# Patient Record
Sex: Female | Born: 1960 | Race: White | Hispanic: No | Marital: Married | Smoking: Never smoker
Health system: Southern US, Community
[De-identification: ages and names within clinical notes are randomized; demographics above are authoritative.]

## PROBLEM LIST (undated history)

## (undated) DIAGNOSIS — E119 Type 2 diabetes mellitus without complications: Secondary | ICD-10-CM

## (undated) DIAGNOSIS — N200 Calculus of kidney: Secondary | ICD-10-CM

## (undated) DIAGNOSIS — I10 Essential (primary) hypertension: Secondary | ICD-10-CM

## (undated) DIAGNOSIS — G4730 Sleep apnea, unspecified: Secondary | ICD-10-CM

## (undated) HISTORY — PX: LITHOTRIPSY: SUR834

## (undated) HISTORY — PX: KIDNEY STONE SURGERY: SHX686

## (undated) HISTORY — PX: BACK SURGERY: SHX140

## (undated) HISTORY — PX: TUBAL LIGATION: SHX77

## (undated) HISTORY — PX: TONSILLECTOMY: SUR1361

---

## 2001-09-09 ENCOUNTER — Encounter: Admission: RE | Admit: 2001-09-09 | Discharge: 2001-09-09 | Payer: Self-pay | Admitting: Unknown Physician Specialty

## 2001-09-09 ENCOUNTER — Encounter: Payer: Self-pay | Admitting: Unknown Physician Specialty

## 2001-10-09 ENCOUNTER — Encounter: Payer: Self-pay | Admitting: Neurosurgery

## 2001-10-11 ENCOUNTER — Inpatient Hospital Stay (HOSPITAL_COMMUNITY): Admission: RE | Admit: 2001-10-11 | Discharge: 2001-10-12 | Payer: Self-pay | Admitting: Neurosurgery

## 2001-10-11 ENCOUNTER — Encounter: Payer: Self-pay | Admitting: Neurosurgery

## 2006-10-14 ENCOUNTER — Emergency Department (HOSPITAL_COMMUNITY): Admission: EM | Admit: 2006-10-14 | Discharge: 2006-10-14 | Payer: Self-pay | Admitting: Emergency Medicine

## 2007-05-09 ENCOUNTER — Encounter (HOSPITAL_COMMUNITY): Admission: RE | Admit: 2007-05-09 | Discharge: 2007-06-08 | Payer: Self-pay | Admitting: Neurology

## 2007-05-28 ENCOUNTER — Emergency Department (HOSPITAL_COMMUNITY): Admission: EM | Admit: 2007-05-28 | Discharge: 2007-05-28 | Payer: Self-pay | Admitting: *Deleted

## 2007-06-12 ENCOUNTER — Encounter (HOSPITAL_COMMUNITY): Admission: RE | Admit: 2007-06-12 | Discharge: 2007-07-12 | Payer: Self-pay | Admitting: Neurology

## 2008-05-31 ENCOUNTER — Emergency Department (HOSPITAL_COMMUNITY): Admission: EM | Admit: 2008-05-31 | Discharge: 2008-06-01 | Payer: Self-pay | Admitting: Emergency Medicine

## 2008-06-01 ENCOUNTER — Encounter: Payer: Self-pay | Admitting: Internal Medicine

## 2008-06-14 ENCOUNTER — Emergency Department (HOSPITAL_COMMUNITY): Admission: EM | Admit: 2008-06-14 | Discharge: 2008-06-14 | Payer: Self-pay | Admitting: Emergency Medicine

## 2008-06-14 ENCOUNTER — Encounter: Payer: Self-pay | Admitting: Internal Medicine

## 2008-06-17 ENCOUNTER — Ambulatory Visit: Payer: Self-pay | Admitting: Internal Medicine

## 2008-06-17 DIAGNOSIS — E785 Hyperlipidemia, unspecified: Secondary | ICD-10-CM | POA: Insufficient documentation

## 2008-06-17 DIAGNOSIS — R079 Chest pain, unspecified: Secondary | ICD-10-CM | POA: Insufficient documentation

## 2008-06-17 DIAGNOSIS — J309 Allergic rhinitis, unspecified: Secondary | ICD-10-CM | POA: Insufficient documentation

## 2008-06-17 DIAGNOSIS — R05 Cough: Secondary | ICD-10-CM

## 2008-06-17 DIAGNOSIS — G4730 Sleep apnea, unspecified: Secondary | ICD-10-CM | POA: Insufficient documentation

## 2008-06-17 DIAGNOSIS — I10 Essential (primary) hypertension: Secondary | ICD-10-CM | POA: Insufficient documentation

## 2008-06-22 ENCOUNTER — Encounter: Payer: Self-pay | Admitting: Pulmonary Disease

## 2008-06-24 ENCOUNTER — Telehealth (INDEPENDENT_AMBULATORY_CARE_PROVIDER_SITE_OTHER): Payer: Self-pay | Admitting: *Deleted

## 2008-06-25 ENCOUNTER — Ambulatory Visit: Payer: Self-pay | Admitting: Internal Medicine

## 2008-06-25 ENCOUNTER — Telehealth: Payer: Self-pay | Admitting: Internal Medicine

## 2008-07-01 ENCOUNTER — Telehealth (INDEPENDENT_AMBULATORY_CARE_PROVIDER_SITE_OTHER): Payer: Self-pay | Admitting: *Deleted

## 2008-07-01 DIAGNOSIS — J328 Other chronic sinusitis: Secondary | ICD-10-CM

## 2008-07-16 ENCOUNTER — Encounter: Payer: Self-pay | Admitting: Internal Medicine

## 2009-04-09 ENCOUNTER — Ambulatory Visit: Payer: Self-pay | Admitting: Diagnostic Radiology

## 2009-04-09 ENCOUNTER — Emergency Department (HOSPITAL_BASED_OUTPATIENT_CLINIC_OR_DEPARTMENT_OTHER): Admission: EM | Admit: 2009-04-09 | Discharge: 2009-04-09 | Payer: Self-pay | Admitting: Emergency Medicine

## 2011-03-24 LAB — CBC
MCHC: 34.1 g/dL (ref 30.0–36.0)
Platelets: 313 10*3/uL (ref 150–400)
RDW: 13.1 % (ref 11.5–15.5)

## 2011-03-24 LAB — DIFFERENTIAL
Basophils Relative: 1 % (ref 0–1)
Lymphs Abs: 1.7 10*3/uL (ref 0.7–4.0)
Monocytes Absolute: 0.4 10*3/uL (ref 0.1–1.0)
Monocytes Relative: 7 % (ref 3–12)
Neutro Abs: 4.4 10*3/uL (ref 1.7–7.7)
Neutrophils Relative %: 65 % (ref 43–77)

## 2011-03-24 LAB — COMPREHENSIVE METABOLIC PANEL
ALT: 28 U/L (ref 0–35)
Albumin: 4.2 g/dL (ref 3.5–5.2)
Alkaline Phosphatase: 67 U/L (ref 39–117)
BUN: 14 mg/dL (ref 6–23)
Calcium: 8.9 mg/dL (ref 8.4–10.5)
Potassium: 3.5 mEq/L (ref 3.5–5.1)
Sodium: 140 mEq/L (ref 135–145)
Total Protein: 7.5 g/dL (ref 6.0–8.3)

## 2011-03-24 LAB — POCT CARDIAC MARKERS
CKMB, poc: <1 ng/mL — ABNORMAL LOW (ref 1.0–8.0)
Troponin i, poc: <0.05 ng/mL (ref 0.00–0.09)
Troponin i, poc: <0.05 ng/mL (ref 0.00–0.09)

## 2011-03-24 LAB — D-DIMER, QUANTITATIVE: D-Dimer, Quant: <0.22 ug/mL-FEU (ref 0.00–0.48)

## 2011-04-29 ENCOUNTER — Emergency Department (HOSPITAL_COMMUNITY)
Admission: EM | Admit: 2011-04-29 | Discharge: 2011-04-29 | Disposition: A | Discharge status: Home / Self Care | Payer: 59 | Attending: Emergency Medicine | Admitting: Emergency Medicine

## 2011-04-29 ENCOUNTER — Emergency Department (HOSPITAL_COMMUNITY): Payer: 59

## 2011-04-29 DIAGNOSIS — R209 Unspecified disturbances of skin sensation: Secondary | ICD-10-CM | POA: Insufficient documentation

## 2011-04-29 DIAGNOSIS — R109 Unspecified abdominal pain: Secondary | ICD-10-CM | POA: Insufficient documentation

## 2011-04-29 DIAGNOSIS — I10 Essential (primary) hypertension: Secondary | ICD-10-CM | POA: Insufficient documentation

## 2011-04-29 DIAGNOSIS — F341 Dysthymic disorder: Secondary | ICD-10-CM | POA: Insufficient documentation

## 2011-04-29 DIAGNOSIS — M542 Cervicalgia: Secondary | ICD-10-CM | POA: Insufficient documentation

## 2011-04-29 DIAGNOSIS — Z79899 Other long term (current) drug therapy: Secondary | ICD-10-CM | POA: Insufficient documentation

## 2011-04-29 DIAGNOSIS — R5381 Other malaise: Secondary | ICD-10-CM | POA: Insufficient documentation

## 2011-04-29 DIAGNOSIS — R5383 Other fatigue: Secondary | ICD-10-CM | POA: Insufficient documentation

## 2011-04-29 DIAGNOSIS — R509 Fever, unspecified: Secondary | ICD-10-CM | POA: Insufficient documentation

## 2011-04-29 DIAGNOSIS — E7800 Pure hypercholesterolemia, unspecified: Secondary | ICD-10-CM | POA: Insufficient documentation

## 2011-04-29 LAB — URINALYSIS, ROUTINE W REFLEX MICROSCOPIC
Glucose, UA: NEGATIVE mg/dL
Hgb urine dipstick: NEGATIVE
Ketones, ur: NEGATIVE mg/dL
pH: 6.5 (ref 5.0–8.0)

## 2011-04-29 LAB — CBC
HCT: 40 % (ref 36.0–46.0)
MCH: 25.9 pg — ABNORMAL LOW (ref 26.0–34.0)
MCHC: 33 g/dL (ref 30.0–36.0)
MCV: 78.6 fL (ref 78.0–100.0)
Platelets: 241 10*3/uL (ref 150–400)
RDW: 14.1 % (ref 11.5–15.5)
WBC: 9.4 10*3/uL (ref 4.0–10.5)

## 2011-04-29 LAB — COMPREHENSIVE METABOLIC PANEL
AST: 23 U/L (ref 0–37)
CO2: 28 mEq/L (ref 19–32)
Calcium: 9.4 mg/dL (ref 8.4–10.5)
Chloride: 100 mEq/L (ref 96–112)
Creatinine, Ser: 0.77 mg/dL (ref 0.4–1.2)
GFR calc Af Amer: >60 mL/min (ref >60)
GFR calc non Af Amer: >60 mL/min (ref >60)
Glucose, Bld: 113 mg/dL — ABNORMAL HIGH (ref 70–99)
Total Bilirubin: 0.4 mg/dL (ref 0.3–1.2)

## 2011-04-29 LAB — DIFFERENTIAL
Eosinophils Absolute: 0.3 10*3/uL (ref 0.0–0.7)
Eosinophils Relative: 3 % (ref 0–5)
Lymphocytes Relative: 18 % (ref 12–46)
Lymphs Abs: 1.7 10*3/uL (ref 0.7–4.0)
Monocytes Absolute: 0.9 10*3/uL (ref 0.1–1.0)
Monocytes Relative: 10 % (ref 3–12)

## 2011-04-29 LAB — LIPASE, BLOOD: Lipase: 30 U/L (ref 11–59)

## 2011-04-29 LAB — POCT CARDIAC MARKERS: Myoglobin, poc: 48.1 ng/mL (ref 12–200)

## 2011-04-29 MED ORDER — IOHEXOL 300 MG/ML  SOLN
80.0000 mL | Freq: Once | INTRAMUSCULAR | Status: AC | PRN
  Administered 2011-04-29: 80 mL via INTRAVENOUS

## 2011-04-30 NOTE — Op Note (Signed)
[place]. [facility]  Patient:    Sophia Burgess, Sophia Burgess Visit Number: [id number] MRN: [id number]          Service Type: SUR Location: [id number] Attending Physician:  Barton Fanny Dictated by:   Hewitt Shorts, M.D. Proc. 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  OPERATION PERFORMED:  Right L5-S1 lumbar laminotomy and microdiskectomy.  SURGEON:  Hewitt Shorts, M.D.  ASSISTANT:  Payton Doughty, M.D.  ANESTHESIA:  General endotracheal.  INDICATIONS FOR PROCEDURE:  The patient is a 50 year old woman who presented with a right lumbar radiculopathy which was found to be secondary to a central to right L5-S1 lumbar disk herniation.  Decision was made to proceed with elective laminotomy and microdiskectomy.  DESCRIPTION OF PROCEDURE:  The patient was brought to the operating room and placed under general endotracheal anesthesia. The patient was turned to a prone position.  Lumbar region was prepped with Betadine soap and solution and draped in sterile fashion.  The midline was infiltrated with local anesthetic with epinephrine and x-ray was taken to localize the L5-S1 level and the midline incision was made over the L5-S1 level and carried down to the subcutaneous tissues.  Bipolar cautery and electrocautery were used to maintain hemostasis throughout the case.  Dissection was carried down to the lumbar fascia which was incised on the right side of the midline and the paraspinal muscles were dissected from the spinous process of the lamina in subperiosteal fashion.  The L5-S1 interlaminar space was identified.  An x-ray was taken to confirm the localization and then a laminotomy was performed using the [facility] Max drill and Kerrison punches.   Ligamentum flavum was carefully resected and the the microscope was draped and brought into the field to provide additional magnification, illumination and visualization. The remainder of the procedure was performed using microdissection and microsurgical technique.   The right L5 and S1 nerve roots were identified. The right L5 nerve root was relatively low lying adjacent to the L5-S1 disk. The thecal sac and right S1 nerve root were gently retracted medially and the disk herniation exposed.  Diskectomy was begun with incision of the annulus and continued with microcurets and pituitary rongeurs.  There was moderate spondylitic overgrowth that was carefully removed as well.  Particular attention was paid to the medial midline portion of the disk herniation which was carefully removed and we were able to progressively decompress the thecal sac and S1 nerve root.  In the end all loose fragments of disk material were removed and good decompression of the thecal sac and nerve roots was achieved. Once the diskectomy was completed and hemostasis established, we instilled 2 cc of fentanyl and 80 mg of Depo-Medrol into the epidural space and then proceeded with closure.  The deep fascia was closed with interrupted 0 undyed Vicryl sutures.  The subcutaneous and subcuticular layer were closed with interrupted inverted 2-0 undyed Vicryl sutures and skin edges were approximated with Dermabond.  The patient tolerated the procedure well. Estimated blood loss for this procedure was 25 cc.  The sponge and needle counts were correct.  Following surgery the patient was turned back to a supine position to  be reversed from anesthetic, extubated and transferred to the recovery room for further care. Dictated by:   Hewitt Shorts, M.D. Attending Physician:  Barton Fanny DD:  10/11/01 TD:  10/11/01 Job: [id number]

## 2011-09-09 LAB — POCT I-STAT, CHEM 8
BUN: 17
Calcium, Ion: 1.15
Calcium, Ion: 1.13
Chloride: 106
Chloride: 104
Creatinine, Ser: 1.1
Glucose, Bld: 120 — ABNORMAL HIGH
Glucose, Bld: 112 — ABNORMAL HIGH
HCT: 37
HCT: 40
Hemoglobin: 12.6
Potassium: 4.1
Sodium: 140
TCO2: 26
TCO2: 26

## 2011-09-09 LAB — POCT CARDIAC MARKERS
CKMB, poc: 1.6
Myoglobin, poc: 63.3
Operator id: 272551
Troponin i, poc: <0.05

## 2011-09-09 LAB — CBC
HCT: 39.5
MCHC: 34.8
MCV: 85.8
Platelets: 334
RDW: 15.1
WBC: 9.3

## 2011-09-09 LAB — D-DIMER, QUANTITATIVE: D-Dimer, Quant: 0.61 — ABNORMAL HIGH

## 2011-09-09 LAB — URINE MICROSCOPIC-ADD ON

## 2011-09-09 LAB — DIFFERENTIAL
Basophils Absolute: 0
Basophils Relative: 1
Eosinophils Absolute: 0.7
Eosinophils Relative: 8 — ABNORMAL HIGH
Lymphs Abs: 1.8
Neutrophils Relative %: 64

## 2011-09-09 LAB — POCT PREGNANCY, URINE
Operator id: 288831
Preg Test, Ur: NEGATIVE

## 2011-09-09 LAB — URINALYSIS, ROUTINE W REFLEX MICROSCOPIC
Bilirubin Urine: NEGATIVE
Ketones, ur: NEGATIVE
Nitrite: NEGATIVE
Protein, ur: NEGATIVE
pH: 6.5

## 2011-09-09 LAB — PREGNANCY, URINE: Preg Test, Ur: NEGATIVE

## 2011-09-29 LAB — I-STAT 8, (EC8 V) (CONVERTED LAB)
BUN: 12
Chloride: 104
Glucose, Bld: 127 — ABNORMAL HIGH
Hemoglobin: 12.6
Operator id: 257131
Sodium: 138

## 2011-09-29 LAB — CBC
HCT: 36.1
MCV: 79.7
Platelets: 282
RBC: 4.53
WBC: 6.3

## 2011-09-29 LAB — DIFFERENTIAL
Lymphocytes Relative: 25
Lymphs Abs: 1.5
Monocytes Relative: 6
Neutrophils Relative %: 65

## 2011-09-29 LAB — POCT I-STAT CREATININE
Creatinine, Ser: 0.9
Operator id: 257131

## 2014-12-24 ENCOUNTER — Other Ambulatory Visit: Payer: Self-pay | Admitting: Neurosurgery

## 2014-12-24 DIAGNOSIS — M4316 Spondylolisthesis, lumbar region: Secondary | ICD-10-CM

## 2015-01-07 ENCOUNTER — Ambulatory Visit
Admission: RE | Admit: 2015-01-07 | Discharge: 2015-01-07 | Disposition: A | Discharge status: Home / Self Care | Payer: PRIVATE HEALTH INSURANCE | Source: Ambulatory Visit | Attending: Neurosurgery | Admitting: Neurosurgery

## 2015-01-07 DIAGNOSIS — M4316 Spondylolisthesis, lumbar region: Secondary | ICD-10-CM

## 2016-11-09 ENCOUNTER — Emergency Department (HOSPITAL_BASED_OUTPATIENT_CLINIC_OR_DEPARTMENT_OTHER)
Admission: EM | Admit: 2016-11-09 | Discharge: 2016-11-09 | Disposition: A | Discharge status: Home / Self Care | Payer: 59 | Attending: Emergency Medicine | Admitting: Emergency Medicine

## 2016-11-09 ENCOUNTER — Emergency Department (HOSPITAL_BASED_OUTPATIENT_CLINIC_OR_DEPARTMENT_OTHER): Payer: 59

## 2016-11-09 ENCOUNTER — Encounter (HOSPITAL_BASED_OUTPATIENT_CLINIC_OR_DEPARTMENT_OTHER): Payer: Self-pay | Admitting: *Deleted

## 2016-11-09 DIAGNOSIS — Z79899 Other long term (current) drug therapy: Secondary | ICD-10-CM | POA: Diagnosis not present

## 2016-11-09 DIAGNOSIS — R109 Unspecified abdominal pain: Secondary | ICD-10-CM | POA: Diagnosis present

## 2016-11-09 DIAGNOSIS — N201 Calculus of ureter: Secondary | ICD-10-CM | POA: Insufficient documentation

## 2016-11-09 DIAGNOSIS — I10 Essential (primary) hypertension: Secondary | ICD-10-CM | POA: Diagnosis not present

## 2016-11-09 DIAGNOSIS — E119 Type 2 diabetes mellitus without complications: Secondary | ICD-10-CM | POA: Diagnosis not present

## 2016-11-09 HISTORY — DX: Sleep apnea, unspecified: G47.30

## 2016-11-09 HISTORY — DX: Essential (primary) hypertension: I10

## 2016-11-09 HISTORY — DX: Type 2 diabetes mellitus without complications: E11.9

## 2016-11-09 HISTORY — DX: Calculus of kidney: N20.0

## 2016-11-09 LAB — URINALYSIS, ROUTINE W REFLEX MICROSCOPIC
GLUCOSE, UA: NEGATIVE mg/dL
Ketones, ur: 15 mg/dL — AB
Nitrite: NEGATIVE
PH: 5.5 (ref 5.0–8.0)
PROTEIN: 100 mg/dL — AB
Specific Gravity, Urine: 1.027 (ref 1.005–1.030)

## 2016-11-09 LAB — PREGNANCY, URINE: Preg Test, Ur: NEGATIVE

## 2016-11-09 LAB — URINE MICROSCOPIC-ADD ON

## 2016-11-09 MED ORDER — HYDROMORPHONE HCL 1 MG/ML IJ SOLN
1.0000 mg | Freq: Once | INTRAMUSCULAR | Status: AC
  Administered 2016-11-09: 1 mg via INTRAVENOUS
  Filled 2016-11-09: qty 1

## 2016-11-09 MED ORDER — KETOROLAC TROMETHAMINE 30 MG/ML IJ SOLN
30.0000 mg | Freq: Once | INTRAMUSCULAR | Status: AC
  Administered 2016-11-09: 30 mg via INTRAVENOUS
  Filled 2016-11-09: qty 1

## 2016-11-09 MED ORDER — TAMSULOSIN HCL 0.4 MG PO CAPS: 0.4000 mg | ORAL_CAPSULE | Freq: Every day | ORAL | 0 refills | Status: AC

## 2016-11-09 MED ORDER — ONDANSETRON 8 MG PO TBDP: 8.0000 mg | ORAL_TABLET | Freq: Three times a day (TID) | ORAL | 0 refills | Status: AC | PRN

## 2016-11-09 MED ORDER — IBUPROFEN 400 MG PO TABS: 400.0000 mg | ORAL_TABLET | Freq: Three times a day (TID) | ORAL | 0 refills | Status: AC | PRN

## 2016-11-09 MED ORDER — OXYCODONE-ACETAMINOPHEN 5-325 MG PO TABS: 1.0000 | ORAL_TABLET | ORAL | 0 refills | Status: AC | PRN

## 2016-11-09 MED FILL — IBUPROFEN 400 MG TABLET: 400 | 5 days supply | Qty: 15 | Fill #0

## 2016-11-09 MED FILL — OXYCODONE/APAP 5/325 MG TAB: 5-325 | 3 days supply | Qty: 15 | Fill #0

## 2016-11-09 MED FILL — ONDANSETRON ODT 8 MG TABLET: 8 | 4 days supply | Qty: 10 | Fill #0

## 2016-11-09 NOTE — ED Provider Notes (Signed)
[facility] Provider Note   CSN: [id number] Arrival date & time: 11/09/16  1213     History   Chief Complaint Chief Complaint  Patient presents with  . Abdominal Pain    HPI Sophia Burgess is a 55 y.o. female.  HPI Patient presents to the emergency department with complaints of increasing right flank and right-sided abdominal pain since 10:00 this morning.  She's had nausea without vomiting.  She denies diarrhea.  She states she has a history of kidney stones and reports this feels similar.  She's required intervention for kidney stones before in the past.  Her pain is colicky.  Her pain is moderate to severe in severity.  No dysuria or urinary frequency.  No fevers or chills.   Past Medical History:  Diagnosis Date  . Diabetes mellitus without complication (HCC)   . Hypertension   . Kidney stones   . Sleep apnea     Patient Active Problem List   Diagnosis Date Noted  . OTHER CHRONIC SINUSITIS 07/01/2008  . HYPERLIPIDEMIA 06/17/2008  . HYPERTENSION 06/17/2008  . ALLERGIC RHINITIS 06/17/2008  . SLEEP APNEA 06/17/2008  . COUGH 06/17/2008  . CHEST PAIN 06/17/2008    Past Surgical History:  Procedure Laterality Date  . BACK SURGERY    . KIDNEY STONE SURGERY    . LITHOTRIPSY    . TONSILLECTOMY    . TUBAL LIGATION      OB History    No data available       Home Medications    Prior to Admission medications   Medication Sig Start Date End Date Taking? Authorizing Provider  clonazePAM (KLONOPIN) 0.5 MG tablet Take 0.5 mg by mouth 3 (three) times daily as needed for anxiety.   Yes Historical Provider, MD  DULoxetine (CYMBALTA) 60 MG capsule Take 60 mg by mouth daily.   Yes Historical Provider, MD  hydrochlorothiazide (HYDRODIURIL) 25 MG tablet Take 25 mg by mouth daily.   Yes Historical Provider, MD  LISINOPRIL PO Take by mouth.   Yes Historical Provider, MD  METOPROLOL TARTRATE PO Take by mouth.   Yes Historical Provider, MD  Pravastatin Sodium  (PRAVACHOL PO) Take by mouth.   Yes Historical Provider, MD  QUEtiapine (SEROQUEL) 50 MG tablet Take 50 mg by mouth at bedtime.   Yes Historical Provider, MD  rOPINIRole (REQUIP) 4 MG tablet Take 4 mg by mouth at bedtime.   Yes Historical Provider, MD    Family History No family history on file.  Social History Social History  Substance Use Topics  . Smoking status: Never Smoker  . Smokeless tobacco: Never Used  . Alcohol use No     Allergies   Clarithromycin   Review of Systems Review of Systems  All other systems reviewed and are negative.    Physical Exam Updated Vital Signs BP 156/96 (BP Location: Right Arm)   Pulse 80   Temp 98.1 F (36.7 C) (Oral)   Resp 18   Ht 5\' 7"  (1.702 m)   Wt 248 lb (112.5 kg)   SpO2 98%   BMI 38.84 kg/m   Physical Exam  Constitutional: She is oriented to person, place, and time. She appears well-developed and well-nourished. No distress.  HENT:  Head: Normocephalic and atraumatic.  Eyes: EOM are normal.  Neck: Normal range of motion.  Cardiovascular: Normal rate, regular rhythm and normal heart sounds.   Pulmonary/Chest: Effort normal and breath sounds normal.  Abdominal: Soft. She exhibits no distension. There is  no tenderness.  Musculoskeletal: Normal range of motion.  Neurological: She is alert and oriented to person, place, and time.  Skin: Skin is warm and dry.  Psychiatric: She has a normal mood and affect. Judgment normal.  Nursing note and vitals reviewed.    ED Treatments / Results  Labs (all labs ordered are listed, but only abnormal results are displayed) Labs Reviewed  URINALYSIS, ROUTINE W REFLEX MICROSCOPIC (NOT AT [facility]) - Abnormal; Notable for the following:       Result Value   Color, Urine RED (*)    APPearance TURBID (*)    Hgb urine dipstick LARGE (*)    Bilirubin Urine MODERATE (*)    Ketones, ur 15 (*)    Protein, ur 100 (*)    Leukocytes, UA MODERATE (*)    All other components within normal  limits  URINE MICROSCOPIC-ADD ON - Abnormal; Notable for the following:    Squamous Epithelial / LPF 6-30 (*)    Bacteria, UA MANY (*)    Crystals CA OXALATE CRYSTALS (*)    All other components within normal limits  URINE CULTURE  PREGNANCY, URINE    EKG  EKG Interpretation None       Radiology Ct Renal Stone Study  Result Date: 11/09/2016 CLINICAL DATA:  Patient with right flank pain.  Nausea. EXAM: CT ABDOMEN AND PELVIS WITHOUT CONTRAST TECHNIQUE: Multidetector CT imaging of the abdomen and pelvis was performed following the standard protocol without IV contrast. COMPARISON:  CT abdomen pelvis 04/29/2011. FINDINGS: Lower chest: Normal heart size. No pericardial effusion. Dependent atelectasis within the bilateral lower lobes. Hepatobiliary: Liver is diffusely low in attenuation, compatible with steatosis. Gallbladder is unremarkable. Pancreas: Unremarkable Spleen: Unremarkable Adrenals/Urinary Tract: The adrenal glands are normal. The right kidney is enlarged with a small amount of perinephric fat stranding. There is moderate right hydroureteronephrosis to the level of an obstructing 5 mm stone within the proximal right ureter. Additional bilateral nonobstructing renal stones are demonstrated measuring approximate 2-4 mm. Stomach/Bowel: No abnormal bowel wall thickening or evidence for bowel obstruction. No free fluid or free intraperitoneal air. Normal morphology of the stomach. Vascular/Lymphatic: Normal caliber abdominal aorta. Peripheral calcified atherosclerotic plaque. No retroperitoneal lymphadenopathy. Reproductive: Slight interval decrease in size of bilateral cystic ovarian lesions measuring 2.6 cm within the right ovary and 2.1 cm within the left ovary. Uterus is unremarkable. Other: None. Musculoskeletal: Lumbar spine degenerative changes. No aggressive or acute appearing osseous lesions. IMPRESSION: Obstructing 5 mm stone within the proximal right ureter resulting in mild to  moderate right hydronephrosis. Multiple additional bilateral renal stones. Hepatic steatosis. Aortic atherosclerosis. Stable to slight interval decrease in size of bilateral ovarian cystic lesions. Electronically Signed   By: Annia Belt M.D.   On: 11/09/2016 13:11    Procedures Procedures (including critical care time)  Medications Ordered in ED Medications  ketorolac (TORADOL) 30 MG/ML injection 30 mg (30 mg Intravenous Given 11/09/16 1306)  HYDROmorphone (DILAUDID) injection 1 mg (1 mg Intravenous Given 11/09/16 1306)     Initial Impression / Assessment and Plan / ED Course  I have reviewed the triage vital signs and the nursing notes.  Pertinent labs & imaging results that were available during my care of the patient were reviewed by me and considered in my medical decision making (see chart for details).  Clinical Course     1:40 PM Feels much better this time.  Right ureteral stone.  Outpatient urology follow-up.  I suspect the urinalysis is contaminated as it  has a significant amount of epithelial cells.  Urine culture sent.  I do not believe this is an infection at this time.  She has no dysuria.  She understands to return to the ER for new or worsening symptoms    Final Clinical Impressions(s) / ED Diagnoses   Final diagnoses:  Right ureteral stone    New Prescriptions New Prescriptions   IBUPROFEN (ADVIL,MOTRIN) 400 MG TABLET    Take 1 tablet (400 mg total) by mouth every 8 (eight) hours as needed.   ONDANSETRON (ZOFRAN ODT) 8 MG DISINTEGRATING TABLET    Take 1 tablet (8 mg total) by mouth every 8 (eight) hours as needed for nausea or vomiting.   OXYCODONE-ACETAMINOPHEN (PERCOCET/ROXICET) 5-325 MG TABLET    Take 1 tablet by mouth every 4 (four) hours as needed for severe pain.   TAMSULOSIN (FLOMAX) 0.4 MG CAPS CAPSULE    Take 1 capsule (0.4 mg total) by mouth daily.     Azalia BilisKevin Raylynn Hersh, MD 11/09/16 1340

## 2016-11-09 NOTE — ED Triage Notes (Signed)
RLQ pain since 1000. Hx of kidney stones and states this pain is consistent with same

## 2016-11-09 NOTE — ED Notes (Signed)
ED Provider at bedside. Dr. Campos 

## 2016-11-10 LAB — URINE CULTURE: Culture: <10000 — AB

## 2017-03-04 IMAGING — CT CT RENAL STONE PROTOCOL
2 of 4 series · 16 of 46 positions shown, 18 images · non-contrast
Comparison: CT abdomen pelvis 04/29/2011.

CLINICAL DATA: Patient with right flank pain.  Nausea.

EXAM:
CT ABDOMEN AND PELVIS WITHOUT CONTRAST
TECHNIQUE: Multidetector CT imaging of the abdomen and pelvis was performed
following the standard protocol without IV contrast.

[Series 2: axial st · axial · 0.98mm/px · z∈[-586,-46]mm · 13 of 118 slices shown, 15 images]
[im 5/118  soft-tissue]
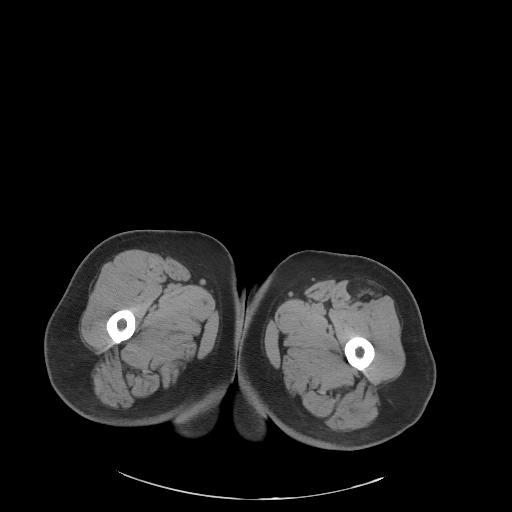
[im 5/118  bone]
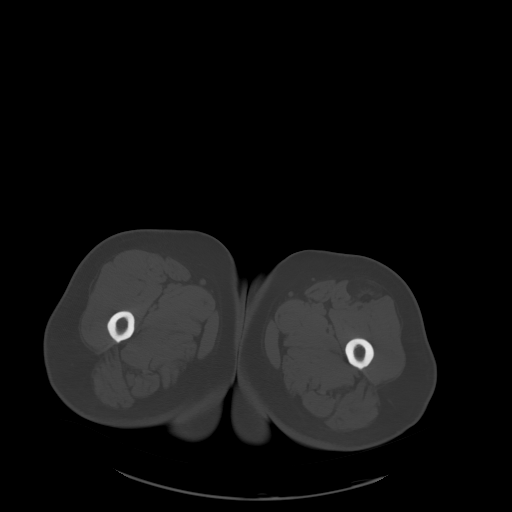
[im 14/118  soft-tissue]
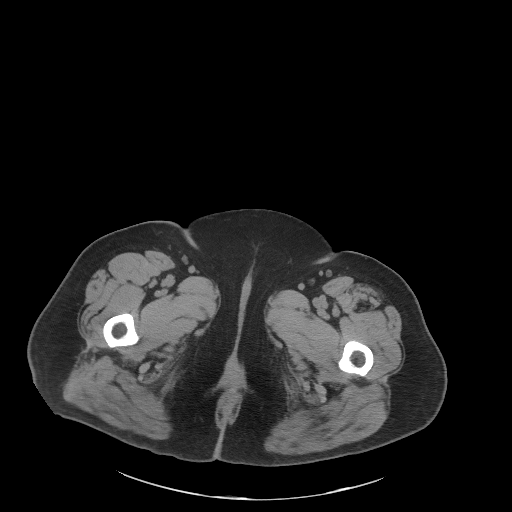
[im 23/118  soft-tissue]
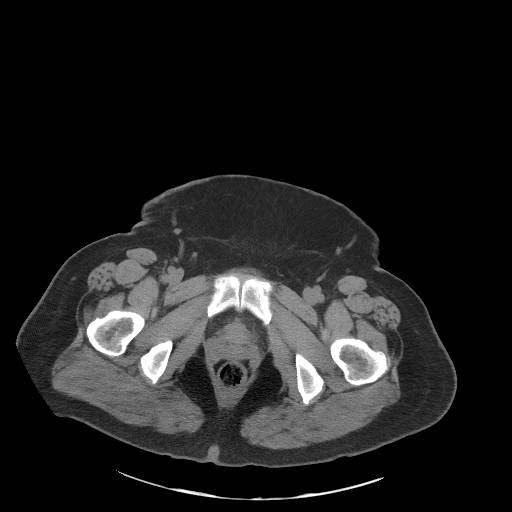
[im 32/118  soft-tissue]
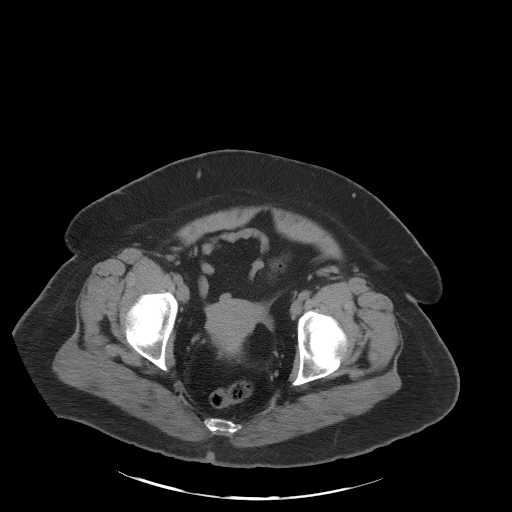
[im 41/118  soft-tissue]
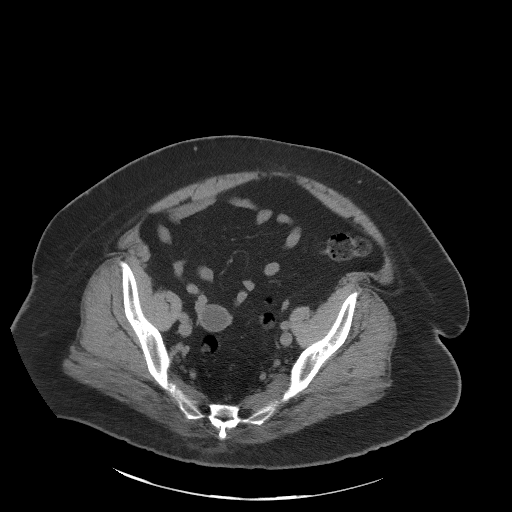
[im 50/118  soft-tissue]
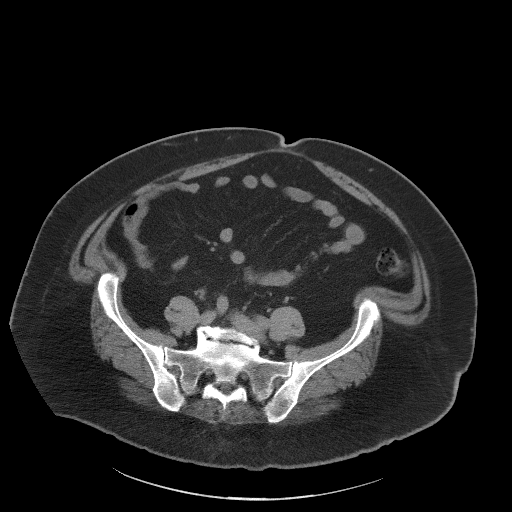
[im 59/118  soft-tissue]
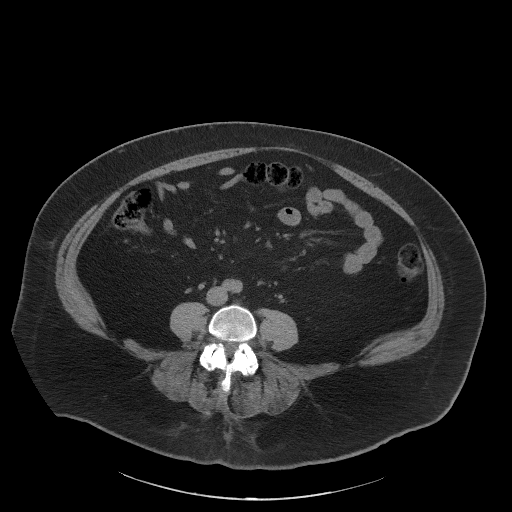
[im 68/118  soft-tissue]
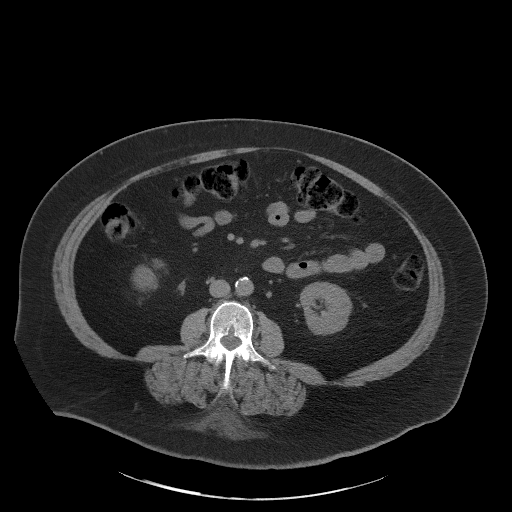
[im 77/118  soft-tissue]
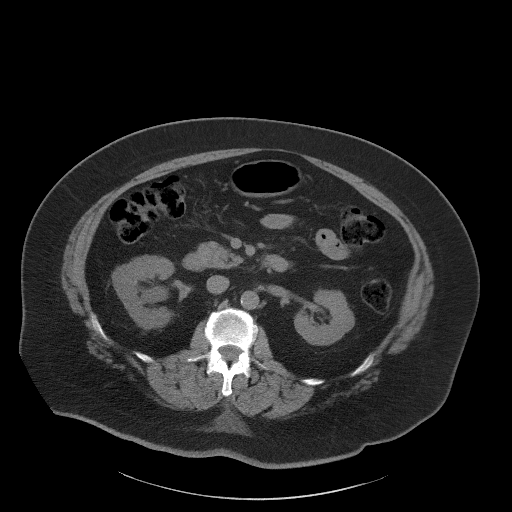
[im 77/118  bone]
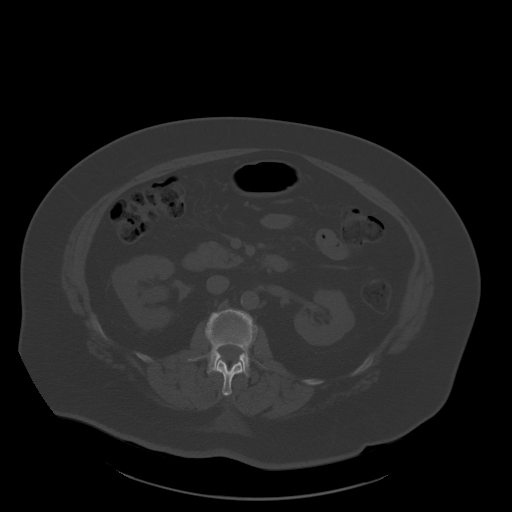
[im 86/118  soft-tissue]
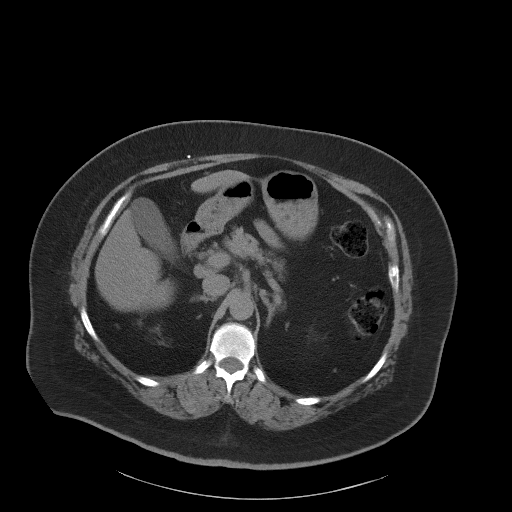
[im 95/118  soft-tissue]
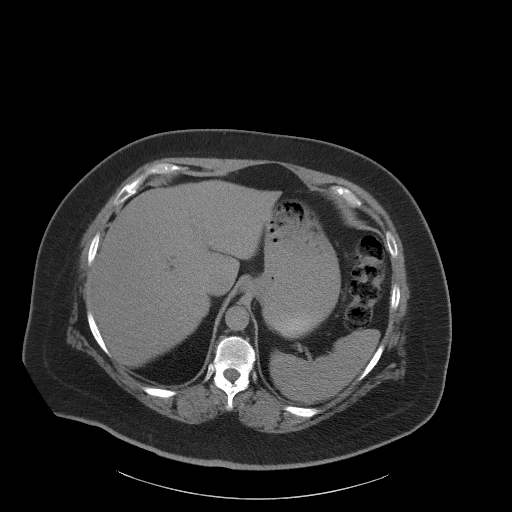
[im 104/118  soft-tissue]
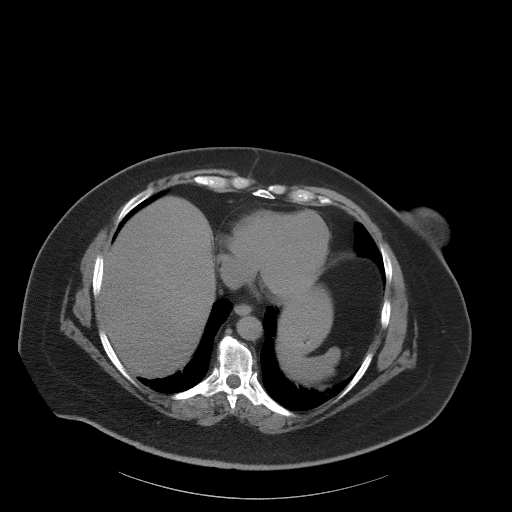
[im 113/118  soft-tissue]
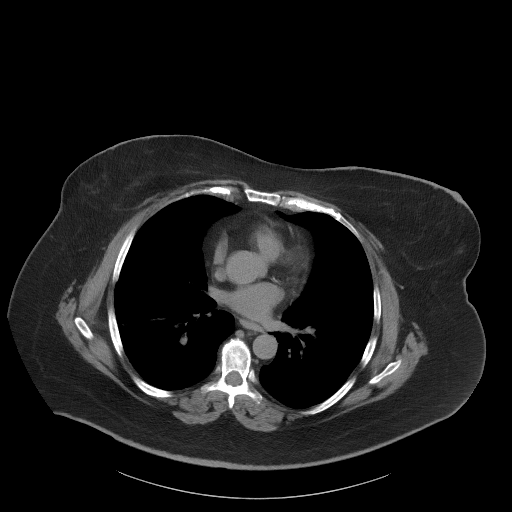

[Series 4: coronal st · coronal · 1.04mm/px · 3 of 121 slices shown]
[im 41/121  soft-tissue]
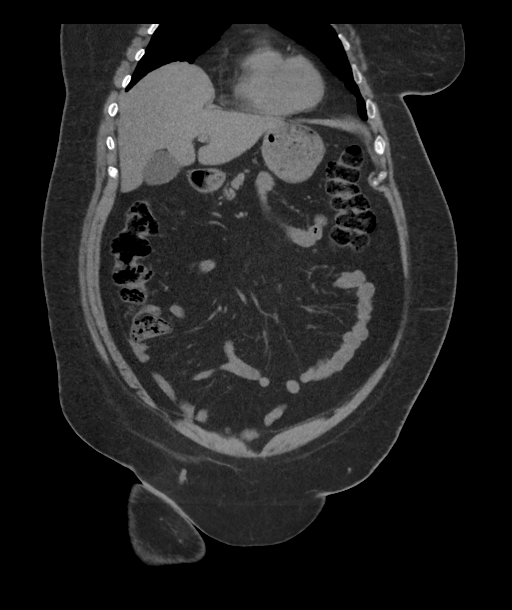
[im 54/121  soft-tissue]
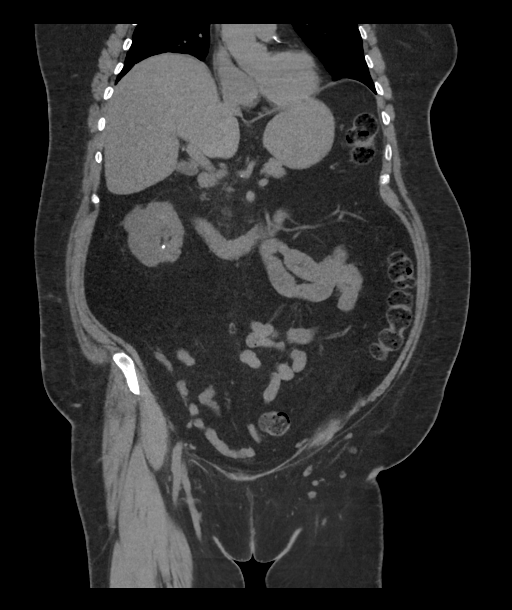
[im 67/121  soft-tissue]
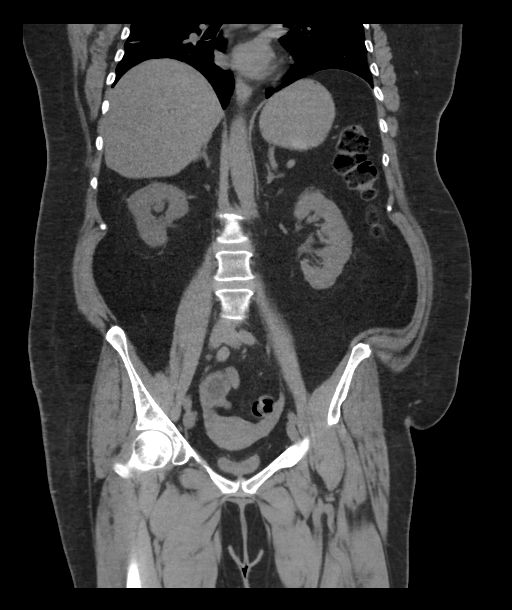

[16 of 46 positions shown; findings below may reference images not displayed]

FINDINGS: Lower chest: Normal heart size. No pericardial effusion. Dependent
atelectasis within the bilateral lower lobes.

Hepatobiliary: Liver is diffusely low in attenuation, compatible
with steatosis. Gallbladder is unremarkable.

Pancreas: Unremarkable

Spleen: Unremarkable

Adrenals/Urinary Tract: The adrenal glands are normal. The right
kidney is enlarged with a small amount of perinephric fat stranding.
There is moderate right hydroureteronephrosis to the level of an
obstructing 5 mm stone within the proximal right ureter. Additional
bilateral nonobstructing renal stones are demonstrated measuring
approximate 2-4 mm.

Stomach/Bowel: No abnormal bowel wall thickening or evidence for
bowel obstruction. No free fluid or free intraperitoneal air. Normal
morphology of the stomach.

Vascular/Lymphatic: Normal caliber abdominal aorta. Peripheral
calcified atherosclerotic plaque. No retroperitoneal
lymphadenopathy.

Reproductive: Slight interval decrease in size of bilateral cystic
ovarian lesions measuring 2.6 cm within the right ovary and 2.1 cm
within the left ovary. Uterus is unremarkable.

Other: None.

Musculoskeletal: Lumbar spine degenerative changes. No aggressive or
acute appearing osseous lesions.
IMPRESSION: Obstructing 5 mm stone within the proximal right ureter resulting in
mild to moderate right hydronephrosis.

Multiple additional bilateral renal stones.

Hepatic steatosis.

Aortic atherosclerosis.

Stable to slight interval decrease in size of bilateral ovarian
cystic lesions.

## 2020-04-10 ENCOUNTER — Ambulatory Visit: Payer: PRIVATE HEALTH INSURANCE | Attending: Family

## 2020-04-10 DIAGNOSIS — Z23 Encounter for immunization: Secondary | ICD-10-CM

## 2020-04-10 NOTE — Progress Notes (Signed)
   Covid-19 Vaccination Clinic  Name:  Sophia Burgess    MRN: [id number] DOB: [birth year]  04/10/2020  [name] was observed post Covid-19 immunization for 15 minutes without incident. She was provided with Vaccine Information Sheet and instruction to access the V-Safe system.   [name] was instructed to call 911 with any severe reactions post vaccine: Marland Kitchen Difficulty breathing  . Swelling of face and throat  . A fast heartbeat  . A bad rash all over body  . Dizziness and weakness   Immunizations Administered    Name Date Dose VIS Date Route   Moderna COVID-19 Vaccine 04/10/2020  3:24 PM 0.5 mL 11/2019 Intramuscular   Manufacturer: Moderna   Lot: [id number]   NDC: 44315-400-86

## 2020-05-06 ENCOUNTER — Ambulatory Visit: Payer: PRIVATE HEALTH INSURANCE | Attending: Family

## 2020-05-06 DIAGNOSIS — Z23 Encounter for immunization: Secondary | ICD-10-CM

## 2020-05-06 NOTE — Progress Notes (Signed)
   Covid-19 Vaccination Clinic  Name:  AUTIE VASUDEVAN    MRN: [id number] DOB: 1961/04/02  05/06/2020  Ms. Toso was observed post Covid-19 immunization for 15 minutes without incident. She was provided with Vaccine Information Sheet and instruction to access the V-Safe system.   Ms. Holloman was instructed to call 911 with any severe reactions post vaccine: Marland Kitchen Difficulty breathing  . Swelling of face and throat  . A fast heartbeat  . A bad rash all over body  . Dizziness and weakness   Immunizations Administered    Name Date Dose VIS Date Route   Moderna COVID-19 Vaccine 05/06/2020  1:13 PM 0.5 mL 11/2019 Intramuscular   Manufacturer: Moderna   Lot: [id number]   NDC: 95747-340-37

## 2022-01-01 ENCOUNTER — Telehealth (HOSPITAL_COMMUNITY): Payer: Self-pay | Admitting: Professional
